# Patient Record
Sex: Female | Born: 1975 | Race: White | Hispanic: No | Marital: Married | Smoking: Never smoker
Health system: Southern US, Community
[De-identification: ages and names within clinical notes are randomized; demographics above are authoritative.]

## PROBLEM LIST (undated history)

## (undated) DIAGNOSIS — N814 Uterovaginal prolapse, unspecified: Secondary | ICD-10-CM

## (undated) HISTORY — DX: Uterovaginal prolapse, unspecified: N81.4

---

## 2005-07-21 HISTORY — PX: TONSILLECTOMY: SUR1361

## 2016-11-03 ENCOUNTER — Encounter: Payer: Self-pay | Admitting: Adult Health

## 2016-11-03 ENCOUNTER — Ambulatory Visit (INDEPENDENT_AMBULATORY_CARE_PROVIDER_SITE_OTHER): Payer: PRIVATE HEALTH INSURANCE | Admitting: Adult Health

## 2016-11-03 ENCOUNTER — Other Ambulatory Visit (HOSPITAL_COMMUNITY)
Admission: RE | Admit: 2016-11-03 | Discharge: 2016-11-03 | Disposition: A | Discharge status: Home / Self Care | Payer: No Typology Code available for payment source | Source: Ambulatory Visit | Attending: Adult Health | Admitting: Adult Health

## 2016-11-03 ENCOUNTER — Encounter (INDEPENDENT_AMBULATORY_CARE_PROVIDER_SITE_OTHER): Payer: Self-pay

## 2016-11-03 VITALS — BP 124/72 | HR 54 | Ht 62.0 in | Wt 126.0 lb

## 2016-11-03 DIAGNOSIS — Z01419 Encounter for gynecological examination (general) (routine) without abnormal findings: Secondary | ICD-10-CM | POA: Insufficient documentation

## 2016-11-03 DIAGNOSIS — N920 Excessive and frequent menstruation with regular cycle: Secondary | ICD-10-CM | POA: Diagnosis present

## 2016-11-03 DIAGNOSIS — N814 Uterovaginal prolapse, unspecified: Secondary | ICD-10-CM

## 2016-11-03 DIAGNOSIS — Z13220 Encounter for screening for lipoid disorders: Secondary | ICD-10-CM

## 2016-11-03 NOTE — Progress Notes (Addendum)
Patient ID: Rebecca Conway, female   DOB: 09/29/1975, 41 y.o.   MRN: [id number] History of Present Illness:  Rebecca Conway is a 41 year old white female, married in complaining of heavy periods, lasting 2 weeks at times, and may change super tampons every hour, and has uterine prolapse, she is due a pap and she wants breast exam. She moved here from [place].   Current Medications, Allergies, Past Medical History, Past Surgical History, Family History and Social History were reviewed in [employer] record.     Review of Systems:  Patient denies any headaches, hearing loss, fatigue, blurred vision, shortness of breath, chest pain, abdominal pain, problems with bowel movements, urination, or intercourse. No joint pain or mood swings.See HPI for positives.   Physical Exam:BP 124/72 (BP Location: Right Arm, Patient Position: Sitting, Cuff Size: Normal)   Pulse (!) 54   Ht  (1.575 m)   Wt 126 lb (57.2 kg)   LMP 10/20/2016 (Exact Date)   BMI 23.05 kg/m  General:  Well developed, well nourished, no acute distress Skin:  Warm and dry Neck:  Midline trachea, normal thyroid, good ROM, no lymphadenopathy Lungs; Clear to auscultation bilaterally Breast:  No dominant palpable mass, retraction, or nipple discharge Cardiovascular: Regular rate and rhythm Abdomen:  Soft, non tender, no hepatosplenomegaly Pelvic:  External genitalia is normal in appearance, no lesions.  The vagina is normal in appearance. Urethra has no lesions or masses. The cervix is bulbous and smooth, pap with HPV and GC/CHL performed.Has 1st degree descensus.  Uterus is felt to be normal size, shape, and contour.  No adnexal masses or tenderness noted.Bladder is non tender, no masses felt. Extremities/musculoskeletal:  No swelling or varicosities noted, no clubbing or cyanosis Psych:  No mood changes, alert and cooperative,seems happy PHQ 2 score 0.  Impression: 1. Encounter for gynecological examination  with Papanicolaou smear of cervix   2. Menorrhagia with regular cycle   3. Uterine prolapse   4. Screening cholesterol level       Plan:  Check CBC,CMP,TSH and lipids Return in 1 week for GYN [place] Physical in 1 year Pap in 3 if normal Get mammogram at 41

## 2016-11-03 NOTE — Patient Instructions (Signed)

## 2016-11-04 ENCOUNTER — Telehealth: Payer: Self-pay | Admitting: Adult Health

## 2016-11-04 LAB — CBC
HEMATOCRIT: 36.3 % (ref 34.0–46.6)
Hemoglobin: 12.2 g/dL (ref 11.1–15.9)
MCH: 31 pg (ref 26.6–33.0)
MCHC: 33.6 g/dL (ref 31.5–35.7)
MCV: 92 fL (ref 79–97)
PLATELETS: 312 10*3/uL (ref 150–379)
RBC: 3.93 x10E6/uL (ref 3.77–5.28)
RDW: 13 % (ref 12.3–15.4)
WBC: 5.7 10*3/uL (ref 3.4–10.8)

## 2016-11-04 LAB — COMPREHENSIVE METABOLIC PANEL
A/G RATIO: 1.9 (ref 1.2–2.2)
ALK PHOS: 57 IU/L (ref 39–117)
ALT: 19 IU/L (ref 0–32)
AST: 17 IU/L (ref 0–40)
Albumin: 4.7 g/dL (ref 3.5–5.5)
BILIRUBIN TOTAL: 0.4 mg/dL (ref 0.0–1.2)
BUN/Creatinine Ratio: 19 (ref 9–23)
BUN: 15 mg/dL (ref 6–20)
CALCIUM: 9.8 mg/dL (ref 8.7–10.2)
CHLORIDE: 102 mmol/L (ref 96–106)
CO2: 24 mmol/L (ref 18–29)
Creatinine, Ser: 0.8 mg/dL (ref 0.57–1.00)
GFR calc Af Amer: 107 mL/min/{1.73_m2} (ref >59)
GFR, EST NON AFRICAN AMERICAN: 93 mL/min/{1.73_m2} (ref >59)
GLOBULIN, TOTAL: 2.5 g/dL (ref 1.5–4.5)
Glucose: 78 mg/dL (ref 65–99)
POTASSIUM: 4.6 mmol/L (ref 3.5–5.2)
Sodium: 144 mmol/L (ref 134–144)
Total Protein: 7.2 g/dL (ref 6.0–8.5)

## 2016-11-04 LAB — LIPID PANEL
CHOLESTEROL TOTAL: 190 mg/dL (ref 100–199)
Chol/HDL Ratio: 3.1 ratio (ref 0.0–4.4)
HDL: 62 mg/dL (ref >39)
LDL CALC: 106 mg/dL — AB (ref 0–99)
TRIGLYCERIDES: 112 mg/dL (ref 0–149)
VLDL Cholesterol Cal: 22 mg/dL (ref 5–40)

## 2016-11-04 LAB — CYTOLOGY - PAP
CHLAMYDIA, DNA PROBE: NEGATIVE
Diagnosis: NEGATIVE
HPV (WINDOPATH): NOT DETECTED
Neisseria Gonorrhea: NEGATIVE

## 2016-11-04 LAB — TSH: TSH: 2.16 u[IU]/mL (ref 0.450–4.500)

## 2016-11-04 NOTE — Telephone Encounter (Signed)
Pt aware labs are good 

## 2016-11-11 ENCOUNTER — Other Ambulatory Visit: Payer: Self-pay | Admitting: Adult Health

## 2016-11-11 DIAGNOSIS — N814 Uterovaginal prolapse, unspecified: Secondary | ICD-10-CM

## 2016-11-11 DIAGNOSIS — N920 Excessive and frequent menstruation with regular cycle: Secondary | ICD-10-CM

## 2016-11-12 ENCOUNTER — Ambulatory Visit (INDEPENDENT_AMBULATORY_CARE_PROVIDER_SITE_OTHER): Payer: PRIVATE HEALTH INSURANCE

## 2016-11-12 ENCOUNTER — Telehealth: Payer: Self-pay | Admitting: Adult Health

## 2016-11-12 DIAGNOSIS — N920 Excessive and frequent menstruation with regular cycle: Secondary | ICD-10-CM

## 2016-11-12 DIAGNOSIS — N814 Uterovaginal prolapse, unspecified: Secondary | ICD-10-CM

## 2016-11-12 NOTE — Telephone Encounter (Signed)
Pt aware [place] was normal but has follicle on right ovary,discussed ablation as option to control periods  if wants ablation to call back for appt with MD to discuss.

## 2016-11-12 NOTE — Progress Notes (Signed)
PELVIC US TA/TV: homogeneous anteverted uterus,wnl,normal ovaries bilat,dominate simple follicle right ovary 2.4 x 1.6 x 2.2 cm,EEC 15 mm,no free fluid,ovaries appear mobile,no pain during ultrasound

## 2016-11-17 ENCOUNTER — Telehealth: Payer: Self-pay | Admitting: Adult Health

## 2016-11-17 MED ORDER — NORETHIN-ETH ESTRAD-FE BIPHAS 1 MG-10 MCG / 10 MCG PO TABS: 1.0000 | ORAL_TABLET | Freq: Every day | ORAL | 11 refills | Status: DC

## 2016-11-17 NOTE — Telephone Encounter (Signed)
Pt called stating that she spoke with Victorino Dike last week about either having an ablation done or getting on a low dosage of birth control. Pt would like to speak with Victorino Dike again regarding this matter. Please contact pt

## 2016-11-17 NOTE — Telephone Encounter (Signed)
Pt wants to try birth control pills to see if helps periods, before ablation, sent Rx for lo loestrin to [facility]

## 2016-11-19 ENCOUNTER — Telehealth: Payer: Self-pay | Admitting: *Deleted

## 2016-11-19 NOTE — Telephone Encounter (Signed)
Can start them Sunday, and take every day

## 2016-11-19 NOTE — Telephone Encounter (Signed)
Patient called stating she was prescribed Loestrin to control period but she started her period on 4/20 and has not stopped bleeding. She is unsure when to start the pills. Please advise.

## 2017-10-20 ENCOUNTER — Other Ambulatory Visit: Payer: Self-pay | Admitting: Adult Health

## 2017-10-20 MED ORDER — NORETHIN-ETH ESTRAD-FE BIPHAS 1 MG-10 MCG / 10 MCG PO TABS: 1.0000 | ORAL_TABLET | Freq: Every day | ORAL | 11 refills | Status: DC

## 2017-10-20 NOTE — Progress Notes (Signed)
Refilled OCs,appt in April

## 2017-11-04 ENCOUNTER — Encounter: Payer: Self-pay | Admitting: Adult Health

## 2017-11-04 ENCOUNTER — Ambulatory Visit (INDEPENDENT_AMBULATORY_CARE_PROVIDER_SITE_OTHER): Payer: PRIVATE HEALTH INSURANCE | Admitting: Adult Health

## 2017-11-04 VITALS — BP 100/80 | HR 97 | Ht 62.0 in | Wt 114.0 lb

## 2017-11-04 DIAGNOSIS — Z1212 Encounter for screening for malignant neoplasm of rectum: Secondary | ICD-10-CM

## 2017-11-04 DIAGNOSIS — Z01419 Encounter for gynecological examination (general) (routine) without abnormal findings: Secondary | ICD-10-CM | POA: Insufficient documentation

## 2017-11-04 DIAGNOSIS — Z1211 Encounter for screening for malignant neoplasm of colon: Secondary | ICD-10-CM | POA: Diagnosis not present

## 2017-11-04 DIAGNOSIS — Z7689 Persons encountering health services in other specified circumstances: Secondary | ICD-10-CM

## 2017-11-04 LAB — HEMOCCULT GUIAC POC 1CARD (OFFICE): FECAL OCCULT BLD: NEGATIVE

## 2017-11-04 MED ORDER — NORETHIN-ETH ESTRAD-FE BIPHAS 1 MG-10 MCG / 10 MCG PO TABS: 1.0000 | ORAL_TABLET | Freq: Every day | ORAL | 11 refills | Status: DC

## 2017-11-04 NOTE — Progress Notes (Signed)
Patient ID: Rebecca Conway, female   DOB: [birth year], 42 y.o.   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    Current Medications, Allergies, Past Medical History, Past Surgical History, Family History and Social History were reviewed in [facility] electronic medical record.     Review of Systems:  Patient denies any headaches, hearing loss, fatigue, blurred vision, shortness of breath, chest pain, abdominal pain, problems with bowel movements, urination, or intercourse. No joint pain or mood swings. Periods lighter and shorter on OCs.   Physical Exam:BP 100/80 (BP Location: Left Arm, Patient Position: Sitting, Cuff Size: Small)   Pulse 97   Ht 5\' 2"  (1.575 m)   Wt 114 lb (51.7 kg)   LMP 11/02/2017   BMI 20.85 kg/m  General:  Well developed, well nourished, no acute distress Skin:  Warm and dry Neck:  Midline trachea, normal thyroid, good ROM, no lymphadenopathy Lungs; Clear to auscultation bilaterally Breast:  No dominant palpable mass, retraction, or nipple discharge Cardiovascular: Regular rate and rhythm Abdomen:  Soft, non tender, no hepatosplenomegaly Pelvic:  External genitalia is normal in appearance, no lesions.  The vagina is normal in appearance. Urethra has no lesions or masses. The cervix is bulbous.  Uterus is felt to be normal size, shape, and contour.  No adnexal masses or tenderness noted.Bladder is non tender, no masses felt. Rectal: Good sphincter tone, no polyps, or hemorrhoids felt.  Hemoccult negative. Extremities/musculoskeletal:  No swelling or varicosities noted, no clubbing or cyanosis Psych:  No mood changes, alert and cooperative,seems happy PHQ 9 score 0.  Impression: 1. Encounter for well woman exam with routine gynecological exam   2. Screening for colorectal cancer   3. Encounter for  menstrual regulation       Plan: Physical in 1 year Pap in 2021 Mammogram now and yearly Colonoscopy at 46 Labs next year Meds ordered this encounter  Medications  . Norethindrone-Ethinyl Estradiol-Fe Biphas (LO LOESTRIN FE) 1 MG-10 MCG / 10 MCG tablet    Sig: Take 1 tablet by mouth daily. Take 1 daily by mouth    Dispense:  1 Package    Refill:  11    BIN [id number], PCN CN, GRP [id number],ID [id number]    Order Specific Question:   Supervising Provider    Answer:   Duane LopeEURE, LUTHER H [[id number]]

## 2018-10-11 ENCOUNTER — Other Ambulatory Visit: Payer: Self-pay | Admitting: Adult Health

## 2018-11-08 ENCOUNTER — Other Ambulatory Visit: Payer: No Typology Code available for payment source | Admitting: Adult Health

## 2019-01-05 ENCOUNTER — Telehealth: Payer: Self-pay | Admitting: Adult Health

## 2019-01-05 NOTE — Telephone Encounter (Signed)

## 2019-01-06 ENCOUNTER — Encounter: Payer: Self-pay | Admitting: Adult Health

## 2019-01-06 ENCOUNTER — Other Ambulatory Visit: Payer: Self-pay

## 2019-01-06 ENCOUNTER — Ambulatory Visit (INDEPENDENT_AMBULATORY_CARE_PROVIDER_SITE_OTHER): Payer: No Typology Code available for payment source | Admitting: Adult Health

## 2019-01-06 VITALS — BP 103/69 | HR 55 | Ht 61.75 in | Wt 109.0 lb

## 2019-01-06 DIAGNOSIS — Z1211 Encounter for screening for malignant neoplasm of colon: Secondary | ICD-10-CM

## 2019-01-06 DIAGNOSIS — Z3041 Encounter for surveillance of contraceptive pills: Secondary | ICD-10-CM | POA: Insufficient documentation

## 2019-01-06 DIAGNOSIS — Z1212 Encounter for screening for malignant neoplasm of rectum: Secondary | ICD-10-CM

## 2019-01-06 DIAGNOSIS — N816 Rectocele: Secondary | ICD-10-CM | POA: Insufficient documentation

## 2019-01-06 DIAGNOSIS — Z01419 Encounter for gynecological examination (general) (routine) without abnormal findings: Secondary | ICD-10-CM | POA: Diagnosis not present

## 2019-01-06 MED ORDER — TAYTULLA 1-20 MG-MCG(24) PO CAPS: 1.0000 | ORAL_CAPSULE | Freq: Every day | ORAL | 3 refills | Status: DC

## 2019-01-06 NOTE — Progress Notes (Signed)
Patient ID: Aleksa Collinsworth, female   DOB: 06/08/1976, 43 y.o.   MRN: [id number] History of Present Illness:  Anabela is a 43 year old white female, married, G1P1, in for a well woman gyn exam, she had a normal pap with negative HPV 11/03/16.   Current Medications, Allergies, Past Medical History, Past Surgical History, Family History and Social History were reviewed in [employer] record.     Review of Systems: Patient denies any  hearing loss, fatigue, blurred vision, shortness of breath, chest pain, abdominal pain, problems with bowel movements, urination, or intercourse. No joint pain or mood swings. Having headaches when weather changes,?migraine, no auras, and having some PMS symptoms and periods have not been normal, may bleed 1 day heavy or 5 days    Physical Exam:BP 103/69 (BP Location: Left Arm, Patient Position: Sitting, Cuff Size: Normal)   Pulse (!) 55   Ht 5' 1.75" (1.568 m)   Wt 109 lb (49.4 kg)   LMP 12/29/2018   BMI 20.10 kg/m  General:  Well developed, well nourished, no acute distress Skin:  Warm and dry Neck:  Midline trachea, normal thyroid, good ROM, no lymphadenopathy Lungs; Clear to auscultation bilaterally Breast:  No dominant palpable mass, retraction, or nipple discharge Cardiovascular: Regular rate and rhythm Abdomen:  Soft, non tender, no hepatosplenomegaly Pelvic:  External genitalia is normal in appearance, no lesions.  The vagina is normal in appearance. Urethra has no lesions or masses. The cervix is bulbous.  Uterus is felt to be normal size, shape, and contour.  No adnexal masses or tenderness noted.Bladder is non tender, no masses felt. Rectal: Good sphincter tone, no polyps, or hemorrhoids felt.  Hemoccult negative.+ low rectocele Extremities/musculoskeletal:  No swelling or varicosities noted, no clubbing or cyanosis Psych:  No mood changes, alert and cooperative,seems happy Fall risk is low PHQ 2 score 0. Examination  chaperoned by Levy Pupa LPN. Will change OCs to [place], 3 packs given and use condoms x 1 pack and she  declines rescue meds for ?migraines, so discussed home measures, such as caffeine, advil, tylenol, rest and increased hydration.   Impression: 1. Encounter for well woman exam with routine gynecological exam   2. Screening for colorectal cancer   3. Encounter for surveillance of contraceptive pills   4. Rectocele       Plan: Meds ordered this encounter  Medications  . Norethin Ace-Eth Estrad-FE (TAYTULLA) 1-20 MG-MCG(24) CAPS    Sig: Take 1 tablet by mouth daily.    Dispense:  84 capsule    Refill:  3    Order Specific Question:   Supervising Provider    Answer:   Tania Ade H [[id number]]  Pap and physical in 1 year Get mammogram

## 2019-12-21 ENCOUNTER — Other Ambulatory Visit: Payer: Self-pay | Admitting: Adult Health

## 2020-02-22 ENCOUNTER — Telehealth: Payer: No Typology Code available for payment source | Admitting: Nurse Practitioner

## 2020-02-22 DIAGNOSIS — K641 Second degree hemorrhoids: Secondary | ICD-10-CM

## 2020-02-22 MED ORDER — HYDROCORTISONE ACETATE 25 MG RE SUPP: 25.0000 mg | Freq: Two times a day (BID) | RECTAL | 0 refills | Status: DC

## 2020-02-22 NOTE — Progress Notes (Signed)
E-Visit for Hemorrhoid  We are sorry that you are not feeling well. We are here to help!  Hemorrhoids are swollen veins in the rectum. They can cause itching, bleeding, and pain. Hemorrhoids are very common.  In some cases, you can see or feel hemorrhoids around the outside of the rectum. In other cases, you cannot see them because they are hidden inside the rectum. Be patient - It can take months for this to improve or go away.   Hemorrhoids do not always cause symptoms. But when they do, symptoms can include: ?Itching of the skin around the anus ?Bleeding - Bleeding is usually painless. You might see bright red blood after using the toilet. ?Pain - If a blood clot forms inside a hemorrhoid, this can cause pain. It can also cause a lump that you might be able to feel.   What can I do to keep from getting more hemorrhoids? -- The most important thing you can do is to keep from getting constipated. You should have a bowel movement at least a few times a week. When you have a bowel movement, you also should not have to push too much. Plus, your bowel movements should not be too hard. Being constipated and having hard bowel movements can make hemorrhoids worse.   I have prescribed Anusol HC suppositories.  Insert into rectum twice per day for 6 days  HOME CARE: Sitz Baths twice daily. Soak buttocks in 2 or 3 inches of warm water for 10 to 15 minutes. Do not add soap, bubble bath, or anything to the water. Stool softener such as Colace 100 mg twice daily AND Miralax 1 scoop daily until you have regular soft stools Over the counter Preparation H Tucks Pads Witch Hazel  Here are some steps you can take to avoid getting constipated or having hard stools:  ?Eat lots of fruits, vegetables, and other foods with fiber. Fiber helps to increase bowel movements. If you do not get enough fiber from your diet, you can take fiber supplements. These come in the form of powders, wafers, or pills. Some  examples are Metamucil, Citrucel, Benefiber and FiberCon. If you take a fiber supplement, be sure to read the label so you know how much to take. If you're not sure, ask your provider or nurse. ?Take medicines called "stool softeners" such as docusate sodium (sample brand names: Colace, Dulcolax). These medicines increase the number of bowel movements you have. They are safe to take and they can prevent problems later.  You should request a referral for a follow up evaluation with a Gastroenterologist (GI doctor) to evaluate this chronic and relapsing condition - even if it improves to see what further steps need to be taken. This is highly linked to chronic constipation and straining to have a bowel movement. It may require further treatment or surgical intervention.   GET HELP RIGHT AWAY IF: You develop severe pain You have heavy bleeding   FOLLOW UP WITH YOUR PRIMARY PROVIDER IF: If your symptoms do not improve within 10 days  MAKE SURE YOU  Understand these instructions. Will watch your condition. Will get help right away if you are not doing well or get worse.  Your e-visit answers were reviewed by a board certified advanced clinical practitioner to complete your personal care plan. Depending upon the condition, your plan could have included both over the counter or prescription medications.  Your safety is important to us. If you have drug allergies check your prescription carefully.     You can use MyChart to ask questions about today's visit, request a non-urgent call back, or ask for a work or school excuse for 24 hours related to this e-Visit. If it has been greater than 24 hours you will need to follow up with your provider, or enter a new e-Visit to address those concerns.  You will get an e-mail with a link to a survey asking about your experience.  We hope that your e-visit has been valuable and will speed your recovery! Thank you for using e-visits.    5-10 minutes spent  reviewing and documenting in chart.   

## 2020-03-13 ENCOUNTER — Other Ambulatory Visit: Payer: Self-pay | Admitting: Adult Health

## 2020-03-20 ENCOUNTER — Other Ambulatory Visit: Payer: Self-pay | Admitting: Adult Health

## 2020-09-13 ENCOUNTER — Telehealth: Payer: Self-pay | Admitting: Adult Health

## 2020-09-13 MED ORDER — NORETHIN ACE-ETH ESTRAD-FE 1-20 MG-MCG(24) PO CAPS: 1.0000 | ORAL_CAPSULE | Freq: Every day | ORAL | 0 refills | Status: DC

## 2020-09-13 NOTE — Telephone Encounter (Signed)
Refilled OCs 

## 2020-09-13 NOTE — Telephone Encounter (Signed)
Pt is needing a refill on her birth control. She has an appt on 4/20. Can you send in enough to last until she is seen? Thanks!! JSY

## 2020-09-13 NOTE — Telephone Encounter (Signed)
Pt has PAP/physical here 11/07/2020 with Dr. Charlotta Newton, will need a refill on Norethin Ace-Eth Estrad-FE 1-20 MG-MCG(24) CAPS until she can be seen?  [facility]   Please advise & notify pt

## 2020-11-07 ENCOUNTER — Other Ambulatory Visit: Payer: Self-pay

## 2020-11-07 ENCOUNTER — Ambulatory Visit (INDEPENDENT_AMBULATORY_CARE_PROVIDER_SITE_OTHER): Payer: No Typology Code available for payment source | Admitting: Obstetrics & Gynecology

## 2020-11-07 ENCOUNTER — Encounter: Payer: Self-pay | Admitting: Obstetrics & Gynecology

## 2020-11-07 ENCOUNTER — Other Ambulatory Visit (HOSPITAL_COMMUNITY)
Admission: RE | Admit: 2020-11-07 | Discharge: 2020-11-07 | Disposition: A | Discharge status: Home / Self Care | Payer: PRIVATE HEALTH INSURANCE | Source: Ambulatory Visit | Attending: Obstetrics & Gynecology | Admitting: Obstetrics & Gynecology

## 2020-11-07 VITALS — BP 96/71 | HR 78 | Ht 61.0 in | Wt 96.0 lb

## 2020-11-07 DIAGNOSIS — Z01419 Encounter for gynecological examination (general) (routine) without abnormal findings: Secondary | ICD-10-CM | POA: Insufficient documentation

## 2020-11-07 DIAGNOSIS — N939 Abnormal uterine and vaginal bleeding, unspecified: Secondary | ICD-10-CM | POA: Diagnosis not present

## 2020-11-07 DIAGNOSIS — Z1231 Encounter for screening mammogram for malignant neoplasm of breast: Secondary | ICD-10-CM

## 2020-11-07 MED ORDER — NORGESTIMATE-ETH ESTRADIOL 0.25-35 MG-MCG PO TABS: 1.0000 | ORAL_TABLET | Freq: Every day | ORAL | 4 refills | Status: DC

## 2020-11-07 NOTE — Progress Notes (Signed)
WELL-WOMAN EXAMINATION Patient name: Rebecca Conway MRN [id number]  Date of birth: [birth year] Chief Complaint:   Gynecologic Exam  History of Present Illness:   Rebecca Conway is a 45 y.o. G71P1001 female being seen today for a routine well-woman exam.   AUB: Currently on OCP- menses very irregular- sometimes monthly often unpredictable can be weekly or every 3 weeks.  Not heavy- typically 3-5 days.  Significant cramping- usually takes tylenol with minimal improvement.  Heating pad will also help.  Of note, she was started on OCPs- due to heavy periods several years ago.  Denies intermenstrual bleeding  Patient's last menstrual period was 11/06/2020. Denies issues with her menses The current method of family planning is OCP (estrogen/progesterone).    Last pap 2018.  Last mammogram: no prior records. Last colonoscopy: n/a  Depression screen [facility] 2/9 11/07/2020 01/06/2019 11/04/2017 11/03/2016  Decreased Interest 0 0 0 0  Down, Depressed, Hopeless 0 0 0 0  PHQ - 2 Score 0 0 0 0  Altered sleeping 0 - 0 -  Tired, decreased energy 1 - 0 -  Change in appetite 1 - 0 -  Feeling bad or failure about yourself  0 - 0 -  Trouble concentrating 0 - 0 -  Moving slowly or fidgety/restless 0 - 0 -  Suicidal thoughts 0 - 0 -  PHQ-9 Score 2 - 0 -      Review of Systems:   Pertinent items are noted in HPI Denies any headaches, blurred vision, fatigue, shortness of breath, chest pain, abdominal pain, bowel movements, urination, or intercourse unless otherwise stated above.  Pertinent History Reviewed:  Reviewed past medical,surgical, social and family history.  Reviewed problem list, medications and allergies. Physical Assessment:   Vitals:   11/07/20 0838  BP: 96/71  Pulse: 78  Weight: 96 lb (43.5 kg)  Height: 5\' 1"  (1.549 m)  Body mass index is 18.14 kg/m.        Physical Examination:   General appearance - well appearing, and in no distress  Mental status - alert, oriented to  person, place, and time  Psych:  She has a normal mood and affect  Skin - warm and dry, normal color, no suspicious lesions noted  Chest - effort normal, all lung fields clear to auscultation bilaterally  Heart - normal rate and regular rhythm  Neck:  midline trachea, no thyromegaly or nodules  Breasts - breasts appear normal, no suspicious masses, no skin or nipple changes or  axillary nodes  Abdomen - soft, nontender, nondistended, no masses or organomegaly  Pelvic - VULVA: normal appearing vulva with no masses, tenderness or lesions  VAGINA: normal appearing vagina with normal color and discharge, no lesions  CERVIX: normal appearing cervix without discharge or lesions, no CMT  Thin prep pap is done with HR HPV cotesting  UTERUS: uterus is felt to be normal size, shape, consistency and nontender   ADNEXA: No adnexal masses or tenderness noted.  Extremities:  No swelling or varicosities noted  Chaperone:     Assessment & Plan:  1) Well-Woman Exam -pap collected -mammogram ordered placed  2) AUB Discussed potential causes- given handout regarding management options -pt may consider endometrial ablation in the future -transitioned to higher dose OCP, f/u in 82mos- may consider ablation at that time []  will need TVUS/EMB if decides to complete ablation  Orders Placed This Encounter  Procedures  . MM 3D SCREEN BREAST BILATERAL    Meds:  Meds ordered this encounter  Medications  . norgestimate-ethinyl estradiol (ORTHO-CYCLEN) 0.25-35 MG-MCG tablet    Sig: Take 1 tablet by mouth daily.    Dispense:  90 tablet    Refill:  4    Follow-up: Return in about 3 months (around 02/06/2021) for follow up regarding AUB , with Dr. Charlotta Newton - please print AVS.   Myna Hidalgo, DO Attending Obstetrician & Gynecologist, [facility] for [facility], [facility]

## 2020-11-07 NOTE — Patient Instructions (Signed)
Please schedule a mammogram at one of the following locations:  [place]: [phone]  [facility] [place]:[phone] [address]  

## 2020-11-12 LAB — CYTOLOGY - PAP
Comment: NEGATIVE
Diagnosis: NEGATIVE
High risk HPV: NEGATIVE

## 2020-11-15 ENCOUNTER — Telehealth: Payer: Self-pay | Admitting: Obstetrics & Gynecology

## 2020-11-15 MED ORDER — NORETHIN ACE-ETH ESTRAD-FE 1-20 MG-MCG(24) PO TABS: 1.0000 | ORAL_TABLET | Freq: Every day | ORAL | 11 refills | Status: DC

## 2020-11-15 NOTE — Telephone Encounter (Signed)
Pt had constipation and nausea and vomiting with ortho cyclen so stopped it Tuesday, will rx loestrin 1/20, start Sunday Has had headaches too, try 3 advil and 2 tylenol with caffeine drink and let me know if helps

## 2020-11-15 NOTE — Telephone Encounter (Signed)
Pt states since starting norgestimate-ethinyl estradiol (ORTHO-CYCLEN) 0.25-35 MG-MCG tablet she's been severely constipated & had vomiting  Would like to switch back to the previous oral BC she was taking, couldn't recall the name of the medicine   Please advise & notify pt     [facility]

## 2021-01-14 ENCOUNTER — Ambulatory Visit (HOSPITAL_COMMUNITY)
Admission: RE | Admit: 2021-01-14 | Discharge: 2021-01-14 | Disposition: A | Discharge status: Home / Self Care | Payer: No Typology Code available for payment source | Source: Ambulatory Visit | Attending: Obstetrics & Gynecology | Admitting: Obstetrics & Gynecology

## 2021-01-14 ENCOUNTER — Other Ambulatory Visit: Payer: Self-pay

## 2021-01-14 DIAGNOSIS — Z1231 Encounter for screening mammogram for malignant neoplasm of breast: Secondary | ICD-10-CM | POA: Insufficient documentation

## 2021-01-15 ENCOUNTER — Other Ambulatory Visit (HOSPITAL_COMMUNITY): Payer: Self-pay | Admitting: Obstetrics & Gynecology

## 2021-01-15 DIAGNOSIS — R928 Other abnormal and inconclusive findings on diagnostic imaging of breast: Secondary | ICD-10-CM

## 2021-01-22 ENCOUNTER — Ambulatory Visit (HOSPITAL_COMMUNITY)
Admission: RE | Admit: 2021-01-22 | Discharge: 2021-01-22 | Disposition: A | Discharge status: Home / Self Care | Payer: PRIVATE HEALTH INSURANCE | Source: Ambulatory Visit | Attending: Obstetrics & Gynecology | Admitting: Obstetrics & Gynecology

## 2021-01-22 DIAGNOSIS — R928 Other abnormal and inconclusive findings on diagnostic imaging of breast: Secondary | ICD-10-CM | POA: Insufficient documentation

## 2021-12-25 ENCOUNTER — Other Ambulatory Visit (HOSPITAL_COMMUNITY): Payer: Self-pay | Admitting: Adult Health

## 2021-12-25 DIAGNOSIS — Z1231 Encounter for screening mammogram for malignant neoplasm of breast: Secondary | ICD-10-CM

## 2022-01-31 ENCOUNTER — Ambulatory Visit (HOSPITAL_COMMUNITY)
Admission: RE | Admit: 2022-01-31 | Discharge: 2022-01-31 | Disposition: A | Discharge status: Home / Self Care | Payer: No Typology Code available for payment source | Source: Ambulatory Visit | Attending: Adult Health | Admitting: Adult Health

## 2022-01-31 DIAGNOSIS — Z1231 Encounter for screening mammogram for malignant neoplasm of breast: Secondary | ICD-10-CM | POA: Diagnosis present

## 2022-10-16 ENCOUNTER — Ambulatory Visit (INDEPENDENT_AMBULATORY_CARE_PROVIDER_SITE_OTHER): Payer: PRIVATE HEALTH INSURANCE | Admitting: Adult Health

## 2022-10-16 ENCOUNTER — Encounter: Payer: Self-pay | Admitting: Adult Health

## 2022-10-16 VITALS — BP 93/63 | HR 68 | Ht 62.0 in | Wt 94.0 lb

## 2022-10-16 DIAGNOSIS — Z1211 Encounter for screening for malignant neoplasm of colon: Secondary | ICD-10-CM | POA: Diagnosis not present

## 2022-10-16 DIAGNOSIS — Z01419 Encounter for gynecological examination (general) (routine) without abnormal findings: Secondary | ICD-10-CM

## 2022-10-16 DIAGNOSIS — Z3202 Encounter for pregnancy test, result negative: Secondary | ICD-10-CM

## 2022-10-16 DIAGNOSIS — R4589 Other symptoms and signs involving emotional state: Secondary | ICD-10-CM

## 2022-10-16 DIAGNOSIS — Z30011 Encounter for initial prescription of contraceptive pills: Secondary | ICD-10-CM

## 2022-10-16 DIAGNOSIS — Z1212 Encounter for screening for malignant neoplasm of rectum: Secondary | ICD-10-CM

## 2022-10-16 DIAGNOSIS — Z13220 Encounter for screening for lipoid disorders: Secondary | ICD-10-CM | POA: Insufficient documentation

## 2022-10-16 DIAGNOSIS — N951 Menopausal and female climacteric states: Secondary | ICD-10-CM

## 2022-10-16 DIAGNOSIS — Z1329 Encounter for screening for other suspected endocrine disorder: Secondary | ICD-10-CM

## 2022-10-16 DIAGNOSIS — N814 Uterovaginal prolapse, unspecified: Secondary | ICD-10-CM

## 2022-10-16 DIAGNOSIS — N816 Rectocele: Secondary | ICD-10-CM

## 2022-10-16 DIAGNOSIS — N926 Irregular menstruation, unspecified: Secondary | ICD-10-CM | POA: Diagnosis not present

## 2022-10-16 LAB — HEMOCCULT GUIAC POC 1CARD (OFFICE): Fecal Occult Blood, POC: NEGATIVE

## 2022-10-16 LAB — POCT URINE PREGNANCY: Preg Test, Ur: NEGATIVE

## 2022-10-16 MED ORDER — LO LOESTRIN FE 1 MG-10 MCG / 10 MCG PO TABS: 1.0000 | ORAL_TABLET | Freq: Every day | ORAL | 11 refills | Status: DC

## 2022-10-16 NOTE — Progress Notes (Addendum)
Patient ID: Rebecca Conway, female   DOB: [birth year], 47 y.o.   MRN: [id number] History of Present Illness: Unnamed is a 47 year old white female,married, G1P1001, in for a well woman gyn exam. She is complaining of irregular periods, has some cramping and moody, and teary at times. She feels bloated at times and may have constipation about once a week, and can't gain weight and feels cold. 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     Current Medications, Allergies, Past Medical History, Past Surgical History, Family History and Social History were reviewed in [employer] record.     Review of Systems: Patient denies any daily headaches, hearing loss, fatigue, blurred vision, shortness of breath, chest pain, abdominal pain, problems with  urination(may leak if holds too long, or intercourse. No joint pain. Denies migraine with aura, but has had migraines.  See HPI for positives.    Physical Exam:BP 93/63 (BP Location: Left Arm, Patient Position: Sitting, Cuff Size: Normal)   Pulse 68   Ht 5\' 2"  (1.575 m)   Wt 94 lb (42.6 kg)   LMP 07/31/2022   BMI 17.19 kg/m   UPT is negative  General:  Well developed, well nourished, no acute distress Skin:  Warm and dry Neck:  Midline trachea, normal thyroid, good ROM, no lymphadenopathy Lungs; Clear to auscultation bilaterally Breast:  No dominant palpable mass, retraction, or nipple discharge Cardiovascular: Regular rate and rhythm Abdomen:  Soft, non tender, no hepatosplenomegaly Pelvic:  External genitalia is normal in appearance, no lesions.  The vagina is normal in appearance. Urethra has no lesions or masses. The cervix is bulbous, and smooth.  Uterus is felt to be normal size, shape, and contour, +uterine descensus.  No adnexal masses or tenderness noted.Bladder is non tender, no masses felt. Rectal: Good sphincter tone, no polyps, or hemorrhoids felt.  Hemoccult negative. +rectocele Extremities/musculoskeletal:  No swelling or  varicosities noted, no clubbing or cyanosis Psych:  No mood changes, alert and cooperative,seems happy AA is 0 Fall risk is low    10/16/2022   10:32 AM 11/07/2020    8:45 AM 01/06/2019    8:44 AM  Depression screen PHQ 2/9  Decreased Interest 0 0 0  Down, Depressed, Hopeless 0 0 0  PHQ - 2 Score 0 0 0  Altered sleeping 0 0   Tired, decreased energy 1 1   Change in appetite 0 1   Feeling bad or failure about yourself  0 0   Trouble concentrating 0 0   Moving slowly or fidgety/restless 0 0   Suicidal thoughts 0 0   PHQ-9 Score 1 2        10/16/2022   10:33 AM 11/07/2020    8:45 AM  GAD 7 : Generalized Anxiety Score  Nervous, Anxious, on Edge 0 0  Control/stop worrying 0 1  Worry too much - different things 0 1  Trouble relaxing 0 0  Restless 0 0  Easily annoyed or irritable 0 1  Afraid - awful might happen 0 0  Total GAD 7 Score 0 3      Upstream - 10/16/22 1047       Pregnancy Intention Screening   Does the patient want to become pregnant in the next year? No    Does the patient's partner want to become pregnant in the next year? No    Would the patient like to discuss contraceptive options today? Yes      Contraception Wrap Up   Current Method  Female Condom    End Method Oral Contraceptive;Female Condom    Contraception Counseling Provided Yes    How was the end contraceptive method provided? Prescription             Examination chaperoned by Levy Pupa LPN   Impression and Plan: 1. Pregnancy examination or test, negative result - POCT urine pregnancy  2. Irregular periods No period since January  UPT is negative  Will try lo Loestrin to regulate periods, 2 packs given can start today and use condoms for 1 pack  Will see back in 8 weeks to see how periods and moods are  3. Encounter for well woman exam with routine gynecological exam Physical in 1 year Pap in 2025 Will check labs Mammogram was negative 01/31/22 - CBC - Comprehensive metabolic  panel - Lipid panel - TSH + free T4 Given Dr Geralynn Ochs name to see for PCP  4. Encounter for screening fecal occult blood testing Hemoccult was negative   5. Screening for colorectal cancer Referred to Dr Gala Romney for colonoscopy - Ambulatory referral to Gastroenterology  6. Uterine prolapse  7. Rectocele  8. Screening cholesterol level - Lipid panel  9. Screening for thyroid disorder Feels cold and can't gain weight  - TSH + free T4  10. Encounter for initial prescription of contraceptive pills Meds ordered this encounter  Medications   Norethindrone-Ethinyl Estradiol-Fe Biphas (LO LOESTRIN FE) 1 MG-10 MCG / 10 MCG tablet    Sig: Take 1 tablet by mouth daily. Take 1 daily by mouth    Dispense:  28 tablet    Refill:  11    BIN [id number], PCN CN, GRP [id number]    Order Specific Question:   Supervising Provider    Answer:   Elonda Husky, LUTHER H [[id number]]     11. Moody Will see if lo Loestrin helps with moods  12. Perimenopause Review handout on perimenopause

## 2022-10-17 LAB — COMPREHENSIVE METABOLIC PANEL
ALT: 18 IU/L (ref 0–32)
AST: 20 IU/L (ref 0–40)
Albumin/Globulin Ratio: 1.8 (ref 1.2–2.2)
Albumin: 4.6 g/dL (ref 3.9–4.9)
Alkaline Phosphatase: 77 IU/L (ref 44–121)
BUN/Creatinine Ratio: 12 (ref 9–23)
BUN: 20 mg/dL (ref 6–24)
Bilirubin Total: <0.2 mg/dL (ref 0.0–1.2)
CO2: 15 mmol/L — ABNORMAL LOW (ref 20–29)
Calcium: 9.9 mg/dL (ref 8.7–10.2)
Chloride: 100 mmol/L (ref 96–106)
Creatinine, Ser: 1.68 mg/dL — ABNORMAL HIGH (ref 0.57–1.00)
Globulin, Total: 2.5 g/dL (ref 1.5–4.5)
Glucose: 89 mg/dL (ref 70–99)
Potassium: 3.8 mmol/L (ref 3.5–5.2)
Sodium: 133 mmol/L — ABNORMAL LOW (ref 134–144)
Total Protein: 7.1 g/dL (ref 6.0–8.5)
eGFR: 38 mL/min/{1.73_m2} — ABNORMAL LOW (ref >59)

## 2022-10-17 LAB — LIPID PANEL
Chol/HDL Ratio: 3 ratio (ref 0.0–4.4)
Cholesterol, Total: 293 mg/dL — ABNORMAL HIGH (ref 100–199)
HDL: 97 mg/dL (ref >39)
LDL Chol Calc (NIH): 173 mg/dL — ABNORMAL HIGH (ref 0–99)
Triglycerides: 133 mg/dL (ref 0–149)
VLDL Cholesterol Cal: 23 mg/dL (ref 5–40)

## 2022-10-17 LAB — TSH+FREE T4
Free T4: 0.95 ng/dL (ref 0.82–1.77)
TSH: 3.59 u[IU]/mL (ref 0.450–4.500)

## 2022-10-17 LAB — CBC
Hematocrit: 39.5 % (ref 34.0–46.6)
Hemoglobin: 13.5 g/dL (ref 11.1–15.9)
MCH: 29.9 pg (ref 26.6–33.0)
MCHC: 34.2 g/dL (ref 31.5–35.7)
MCV: 88 fL (ref 79–97)
Platelets: 496 10*3/uL — ABNORMAL HIGH (ref 150–450)
RBC: 4.51 x10E6/uL (ref 3.77–5.28)
RDW: 13.8 % (ref 11.7–15.4)
WBC: 9.1 10*3/uL (ref 3.4–10.8)

## 2022-10-20 ENCOUNTER — Other Ambulatory Visit: Payer: Self-pay | Admitting: Adult Health

## 2022-10-20 ENCOUNTER — Encounter: Payer: Self-pay | Admitting: *Deleted

## 2022-10-20 DIAGNOSIS — R7989 Other specified abnormal findings of blood chemistry: Secondary | ICD-10-CM | POA: Insufficient documentation

## 2022-11-11 IMAGING — MG MM DIGITAL DIAGNOSTIC UNILAT*L* W/ TOMO W/ CAD
4 series · 4 of 12 positions shown · non-contrast
Comparison: Baseline screening mammogram dated 01/14/2021

CLINICAL DATA: Possible asymmetry in the upper left breast on a
recent baseline screening mammogram.

EXAM:
DIGITAL DIAGNOSTIC UNILATERAL LEFT MAMMOGRAM WITH TOMOSYNTHESIS AND
CAD
TECHNIQUE: Left digital diagnostic mammography and breast tomosynthesis was
performed. The images were evaluated with computer-aided detection.

[L MLO synth-2D]
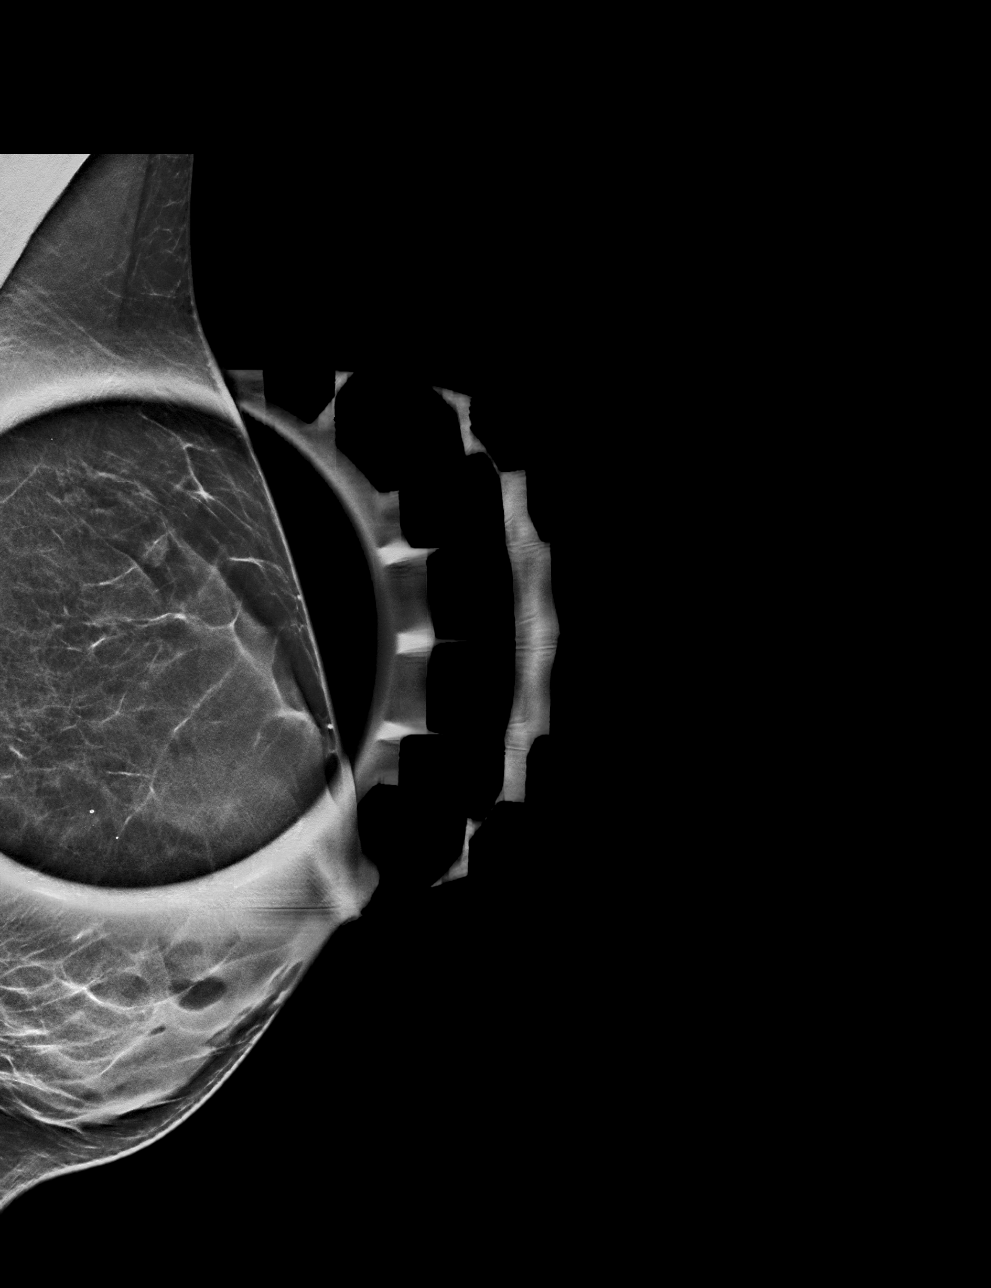

[L ML synth-2D]
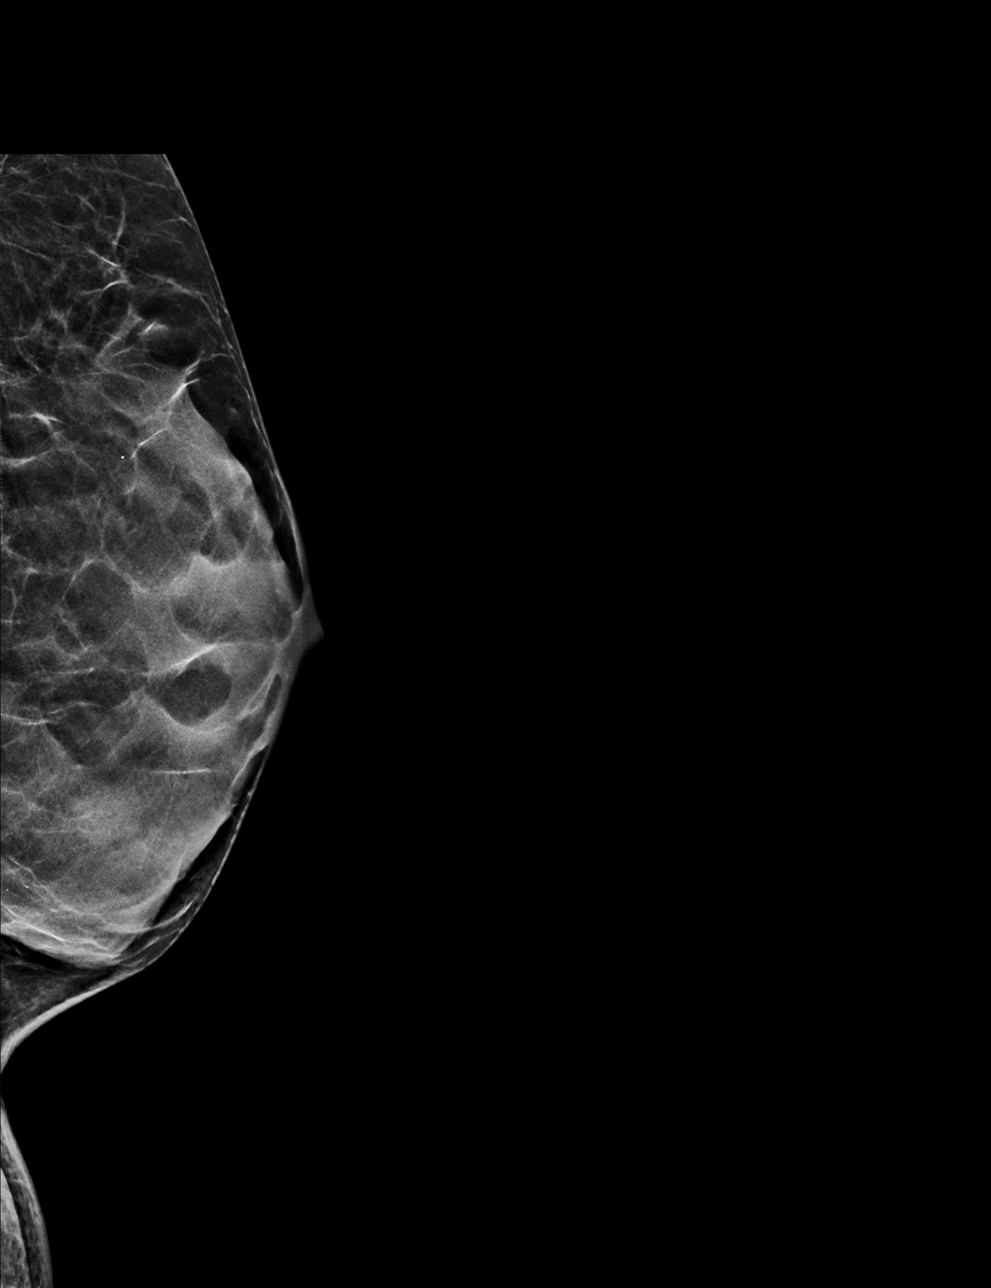

[L MLO tomo · tomo slice 19/38.0]
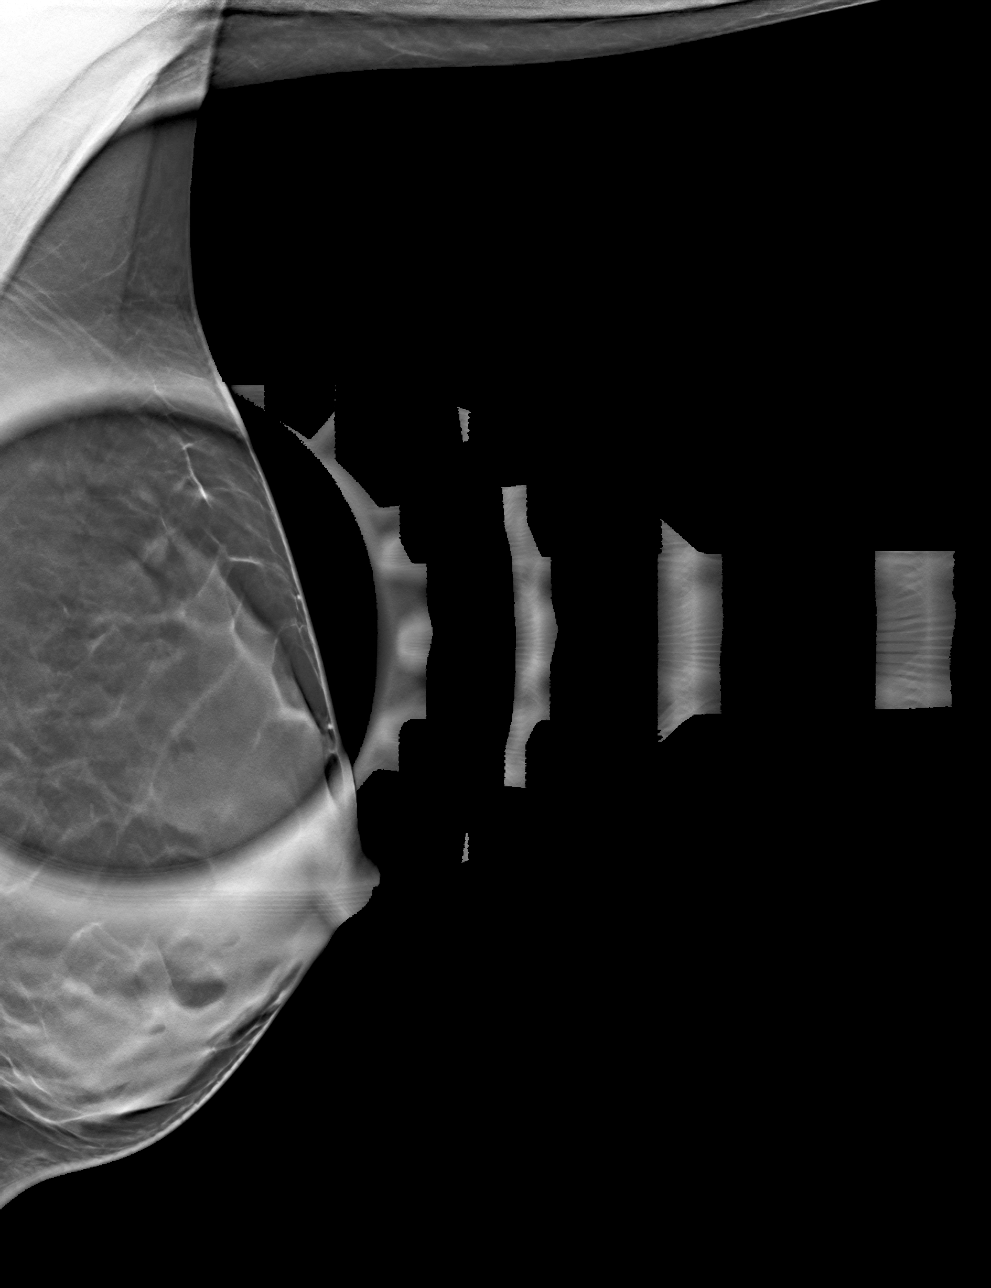

[L ML tomo · tomo slice 19/38.0]
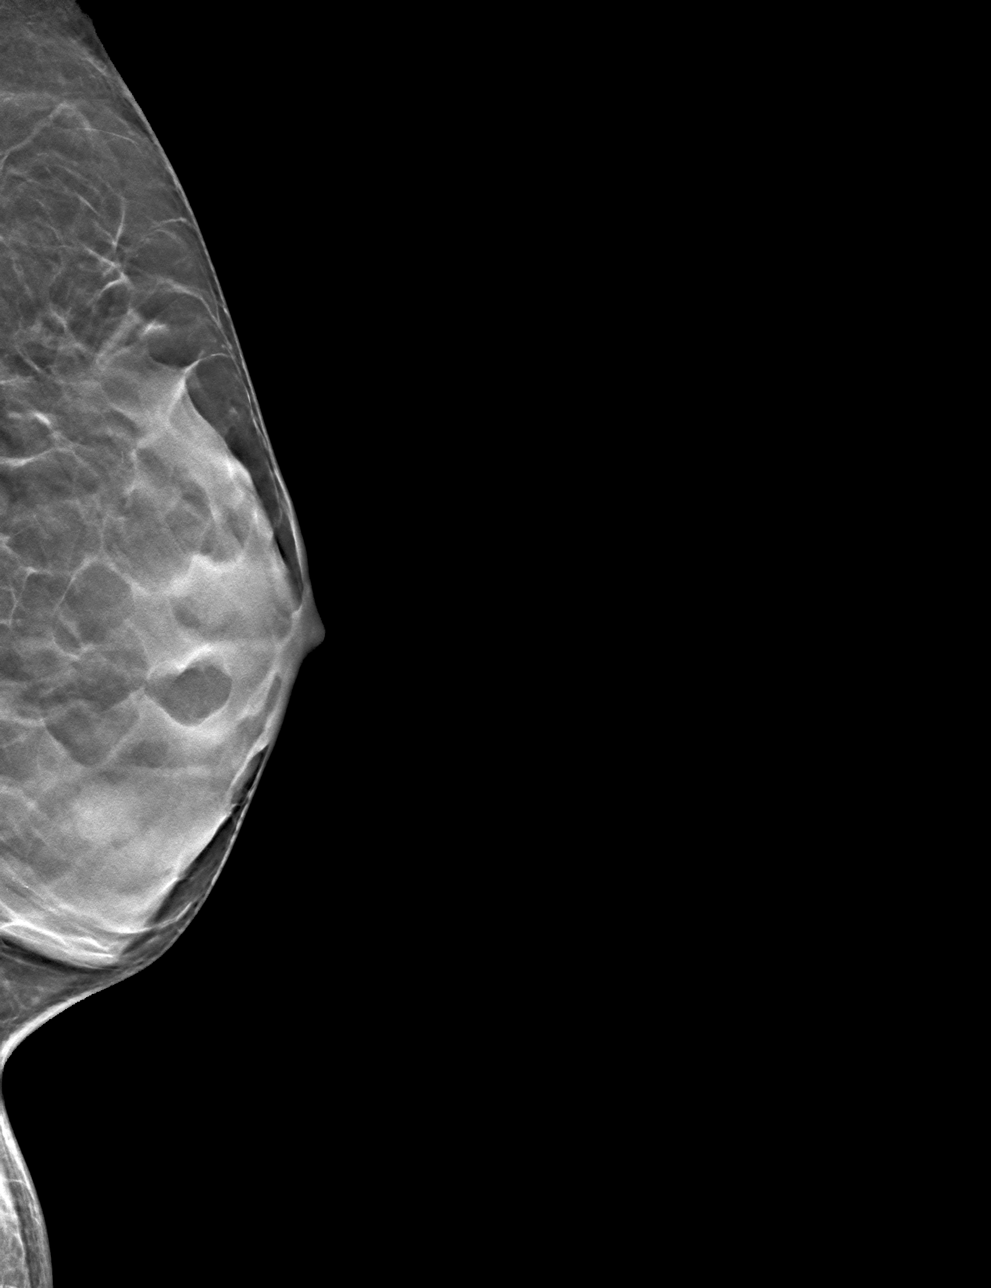

[4 of 12 positions shown; findings below may reference images not displayed]

ACR Breast Density Category d: The breast tissue is extremely dense,
which lowers the sensitivity of mammography.
FINDINGS: 3D tomographic and 2D generated true lateral and spot compression
oblique images of the left breast demonstrate normal appearing
glandular tissue at the location of the recently suspected
asymmetry.
IMPRESSION: No evidence of malignancy. The recently suspected left breast
asymmetry was close apposition of normal breast tissue.

RECOMMENDATION:
Bilateral screening mammogram in 1 year.

I have discussed the findings and recommendations with the patient.
If applicable, a reminder letter will be sent to the patient
regarding the next appointment.

BI-RADS CATEGORY  1: Negative.

## 2022-12-11 ENCOUNTER — Ambulatory Visit: Payer: PRIVATE HEALTH INSURANCE | Admitting: Adult Health

## 2022-12-22 ENCOUNTER — Other Ambulatory Visit (HOSPITAL_COMMUNITY): Payer: Self-pay | Admitting: Adult Health

## 2022-12-22 DIAGNOSIS — Z1231 Encounter for screening mammogram for malignant neoplasm of breast: Secondary | ICD-10-CM

## 2023-02-04 ENCOUNTER — Ambulatory Visit (HOSPITAL_COMMUNITY): Payer: PRIVATE HEALTH INSURANCE

## 2023-02-05 ENCOUNTER — Encounter (HOSPITAL_COMMUNITY): Payer: Self-pay

## 2023-02-05 ENCOUNTER — Ambulatory Visit (HOSPITAL_COMMUNITY)
Admission: RE | Admit: 2023-02-05 | Discharge: 2023-02-05 | Disposition: A | Discharge status: Home / Self Care | Payer: PRIVATE HEALTH INSURANCE | Source: Ambulatory Visit | Attending: Adult Health | Admitting: Adult Health

## 2023-02-05 DIAGNOSIS — Z1231 Encounter for screening mammogram for malignant neoplasm of breast: Secondary | ICD-10-CM | POA: Insufficient documentation

## 2023-10-19 ENCOUNTER — Ambulatory Visit (INDEPENDENT_AMBULATORY_CARE_PROVIDER_SITE_OTHER): Payer: PRIVATE HEALTH INSURANCE | Admitting: Adult Health

## 2023-10-19 ENCOUNTER — Other Ambulatory Visit (HOSPITAL_COMMUNITY)
Admission: RE | Admit: 2023-10-19 | Discharge: 2023-10-19 | Disposition: A | Discharge status: Home / Self Care | Payer: PRIVATE HEALTH INSURANCE | Source: Ambulatory Visit | Attending: Adult Health | Admitting: Adult Health

## 2023-10-19 ENCOUNTER — Other Ambulatory Visit: Payer: Self-pay | Admitting: Adult Health

## 2023-10-19 ENCOUNTER — Encounter: Payer: Self-pay | Admitting: Adult Health

## 2023-10-19 VITALS — BP 104/66 | HR 61 | Ht 62.0 in | Wt 96.0 lb

## 2023-10-19 DIAGNOSIS — Z1331 Encounter for screening for depression: Secondary | ICD-10-CM

## 2023-10-19 DIAGNOSIS — N816 Rectocele: Secondary | ICD-10-CM

## 2023-10-19 DIAGNOSIS — R7989 Other specified abnormal findings of blood chemistry: Secondary | ICD-10-CM | POA: Diagnosis not present

## 2023-10-19 DIAGNOSIS — Z01419 Encounter for gynecological examination (general) (routine) without abnormal findings: Secondary | ICD-10-CM | POA: Insufficient documentation

## 2023-10-19 DIAGNOSIS — Z1211 Encounter for screening for malignant neoplasm of colon: Secondary | ICD-10-CM

## 2023-10-19 DIAGNOSIS — N814 Uterovaginal prolapse, unspecified: Secondary | ICD-10-CM

## 2023-10-19 DIAGNOSIS — Z3041 Encounter for surveillance of contraceptive pills: Secondary | ICD-10-CM

## 2023-10-19 DIAGNOSIS — Z13220 Encounter for screening for lipoid disorders: Secondary | ICD-10-CM

## 2023-10-19 LAB — HEMOCCULT GUIAC POC 1CARD (OFFICE): Fecal Occult Blood, POC: NEGATIVE

## 2023-10-19 MED ORDER — LO LOESTRIN FE 1 MG-10 MCG / 10 MCG PO TABS: 1.0000 | ORAL_TABLET | Freq: Every day | ORAL | 11 refills | Status: AC

## 2023-10-19 NOTE — Progress Notes (Unsigned)
Referral sent to [facility] for colonoscopy

## 2023-10-19 NOTE — Progress Notes (Signed)
 Patient ID: Rebecca Conway, female   DOB: [birth year], 48 y.o.   MRN: [id number] History of Present Illness: Rebecca Conway is a 48 year old white female,married, G1P1001 in for a well woman gyn exam and pap. She is looking for PCP in [place].  Current Medications, Allergies, Past Medical History, Past Surgical History, Family History and Social History were reviewed in [employer] record.     Review of Systems: Patient denies any headaches, hearing loss, fatigue, blurred vision, shortness of breath, chest pain, abdominal pain, problems with bowel movements, urination, or intercourse. No joint pain or mood swings.  Periods fairly regular on lo Loestrin    Physical Exam:BP 104/66 (BP Location: Right Arm, Patient Position: Sitting, Cuff Size: Normal)   Pulse 61   Ht 5\' 2"  (1.575 m)   Wt 96 lb (43.5 kg)   LMP 08/23/2023   BMI 17.56 kg/m   General:  Well developed, well nourished, no acute distress Skin:  Warm and dry Neck:  Midline trachea, normal thyroid, good ROM, no lymphadenopathy Lungs; Clear to auscultation bilaterally Breast:  No dominant palpable mass, retraction, or nipple discharge Cardiovascular: Regular rate and rhythm Abdomen:  Soft, non tender, no hepatosplenomegaly Pelvic:  External genitalia is normal in appearance, no lesions.  The vagina is normal in appearance. Urethra has no lesions or masses. The cervix is bulbous, and smooth, pap with HR HPV genotyping performed.  Uterus is felt to be normal size, shape, and contour, +uterine descensus.  No adnexal masses or tenderness noted.Bladder is non tender, no masses felt. Rectal: Good sphincter tone, no polyps, or hemorrhoids felt.  Hemoccult negative.+tectoclele Extremities/musculoskeletal:  No swelling or varicosities noted, no clubbing or cyanosis Psych:  No mood changes, alert and cooperative,seems happy AA is 0 Fall risk is low    10/19/2023   10:15 AM 10/16/2022   10:32 AM 11/07/2020    8:45 AM   Depression screen PHQ 2/9  Decreased Interest 0 0 0  Down, Depressed, Hopeless 0 0 0  PHQ - 2 Score 0 0 0  Altered sleeping 0 0 0  Tired, decreased energy 0 1 1  Change in appetite 0 0 1  Feeling bad or failure about yourself  0 0 0  Trouble concentrating 0 0 0  Moving slowly or fidgety/restless 0 0 0  Suicidal thoughts 0 0 0  PHQ-9 Score 0 1 2       10/19/2023   10:15 AM 10/16/2022   10:33 AM 11/07/2020    8:45 AM  GAD 7 : Generalized Anxiety Score  Nervous, Anxious, on Edge 0 0 0  Control/stop worrying 0 0 1  Worry too much - different things 0 0 1  Trouble relaxing 0 0 0  Restless 0 0 0  Easily annoyed or irritable 0 0 1  Afraid - awful might happen 0 0 0  Total GAD 7 Score 0 0 3    Upstream - 10/19/23 1020       Pregnancy Intention Screening   Does the patient want to become pregnant in the next year? No    Does the patient's partner want to become pregnant in the next year? No    Would the patient like to discuss contraceptive options today? No      Contraception Wrap Up   Current Method Oral Contraceptive    End Method Oral Contraceptive    Contraception Counseling Provided Yes            Examination chaperoned by Marylu Lund  Young LPN    Impression and plan: 1. Encounter for gynecological examination with Papanicolaou smear of cervix (Primary) Pap sent Pap in 3 years if normal Physical in 1 year Check labs, she has orders Mammogram was negative 02/05/24 Get colonoscopy rescheduled  - Cytology - PAP( [place]) - CBC - Comprehensive metabolic panel with GFR - Lipid panel  2. Uterine prolapse  3. Rectocele  4. Encounter for screening fecal occult blood testing Hemoccult was negative  - POCT occult blood stool  5. Elevated serum creatinine - Comprehensive metabolic panel with GFR  6. Screening cholesterol level - Lipid panel  7. Encounter for surveillance of contraceptive pills Happy wit lo Loestrin, will refill Meds ordered this encounter   Medications   Norethindrone-Ethinyl Estradiol-Fe Biphas (LO LOESTRIN FE) 1 MG-10 MCG / 10 MCG tablet    Sig: Take 1 tablet by mouth daily. Take 1 daily by mouth    Dispense:  28 tablet    Refill:  11    BIN [id number], PCN [id number], GRP [id number] [id number]    Supervising Provider:   Duane Lope H [[id number]]

## 2023-10-20 ENCOUNTER — Encounter: Payer: Self-pay | Admitting: *Deleted

## 2023-10-21 LAB — CYTOLOGY - PAP
Comment: NEGATIVE
Diagnosis: NEGATIVE
High risk HPV: NEGATIVE

## 2023-12-25 ENCOUNTER — Other Ambulatory Visit (HOSPITAL_COMMUNITY): Payer: Self-pay | Admitting: Adult Health

## 2023-12-25 DIAGNOSIS — Z1231 Encounter for screening mammogram for malignant neoplasm of breast: Secondary | ICD-10-CM

## 2024-02-08 ENCOUNTER — Encounter (HOSPITAL_COMMUNITY): Payer: Self-pay

## 2024-02-08 ENCOUNTER — Ambulatory Visit (HOSPITAL_COMMUNITY)
Admission: RE | Admit: 2024-02-08 | Discharge: 2024-02-08 | Disposition: A | Discharge status: Home / Self Care | Payer: PRIVATE HEALTH INSURANCE | Source: Ambulatory Visit | Attending: Adult Health | Admitting: Adult Health

## 2024-02-08 DIAGNOSIS — Z1231 Encounter for screening mammogram for malignant neoplasm of breast: Secondary | ICD-10-CM | POA: Diagnosis present
